# Patient Record
Sex: Male | Born: 1945 | Race: White | Hispanic: No | Marital: Single | Smoking: Never smoker
Health system: Southern US, Community
[De-identification: ages and names within clinical notes are randomized; demographics above are authoritative.]

## PROBLEM LIST (undated history)

## (undated) DIAGNOSIS — Z8669 Personal history of other diseases of the nervous system and sense organs: Secondary | ICD-10-CM

## (undated) HISTORY — PX: COLONOSCOPY: SHX174

## (undated) HISTORY — PX: APPENDECTOMY: SHX54

---

## 2007-12-24 ENCOUNTER — Inpatient Hospital Stay: Payer: Self-pay | Admitting: Urology

## 2007-12-27 ENCOUNTER — Other Ambulatory Visit: Payer: Self-pay

## 2008-01-09 ENCOUNTER — Ambulatory Visit: Payer: Self-pay | Admitting: Gastroenterology

## 2008-01-11 ENCOUNTER — Ambulatory Visit: Payer: Self-pay | Admitting: Gastroenterology

## 2009-05-10 IMAGING — CR DG ABDOMEN 1V
1 series · 2 of 2 positions shown · non-contrast
Comparison: none

REASON FOR EXAM: lithotripsy
COMMENTS:

PROCEDURE:     DXR - DXR KIDNEY URETER BLADDER  - December 28, 2007  [DATE]
RESULT:     Comparison: KUB on 12/24/2007. Renal stone CT on 12/26/2007.

[Series 1: view not recorded · 0.17mm/px · 2 of 2 slices shown]
[im 1/2]
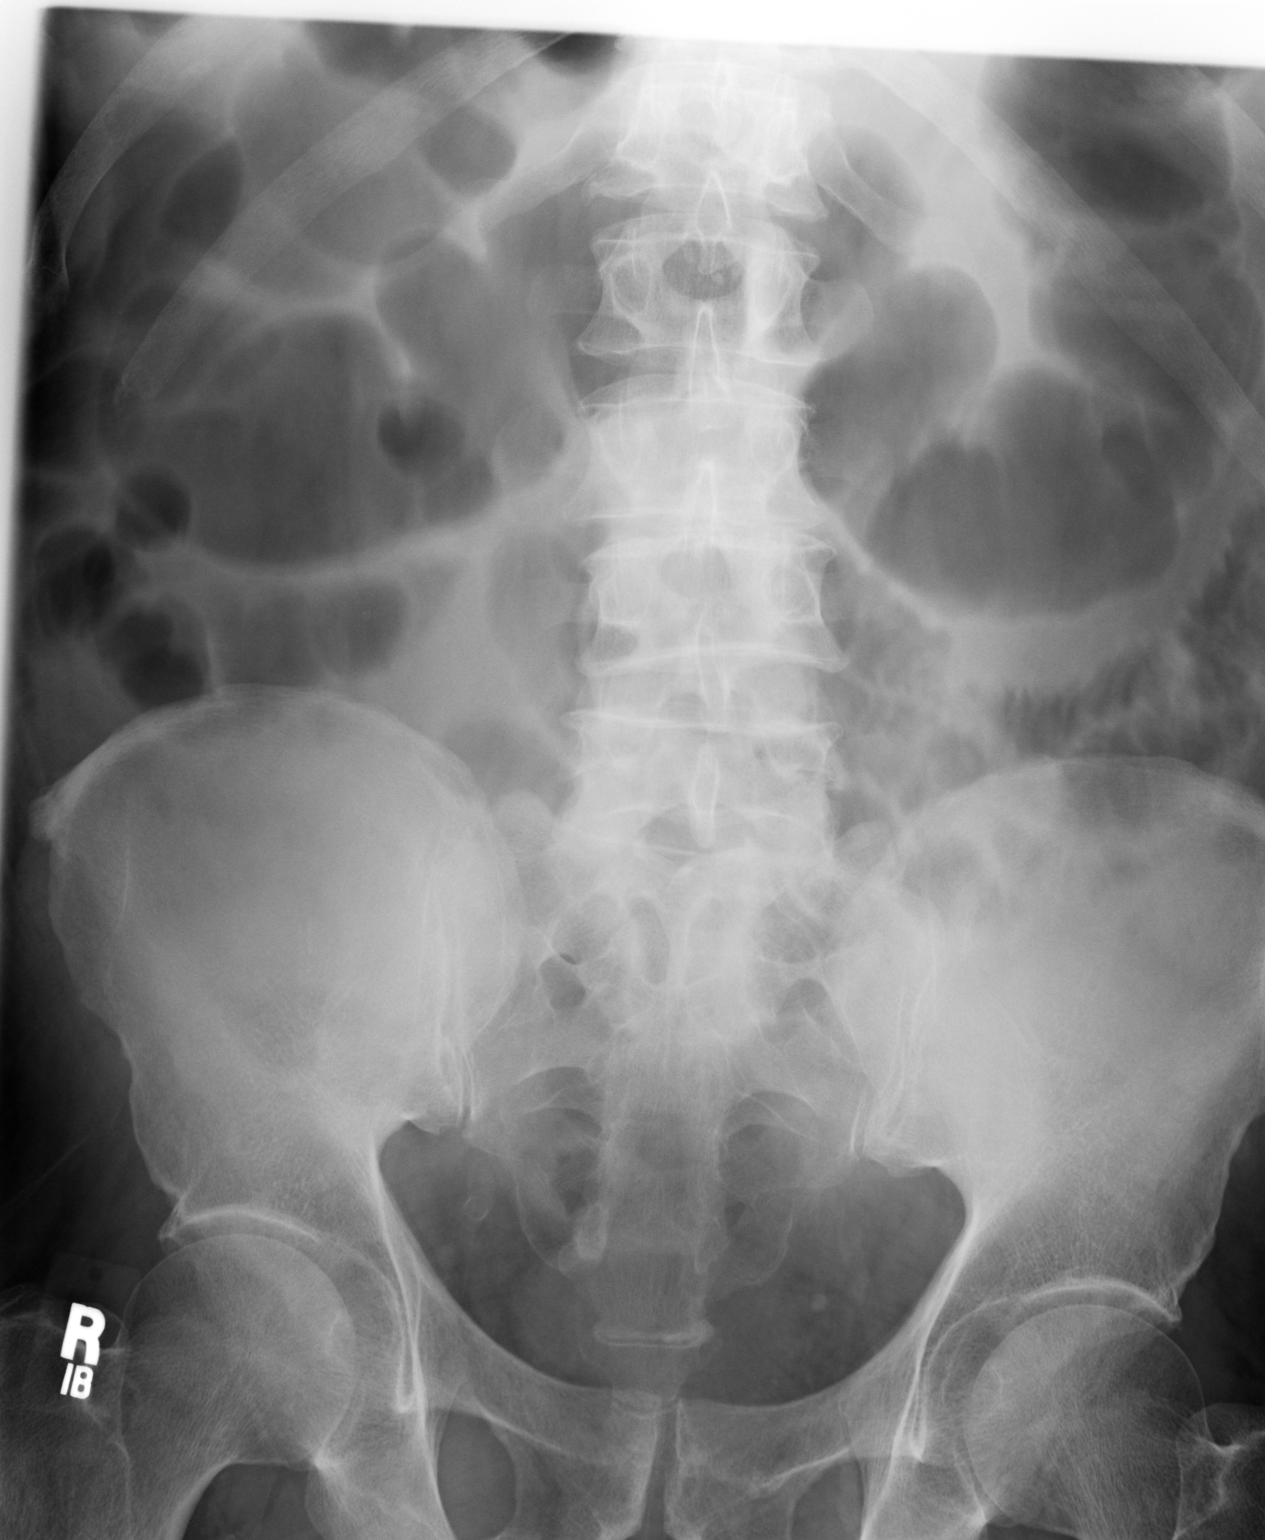
[im 2/2]
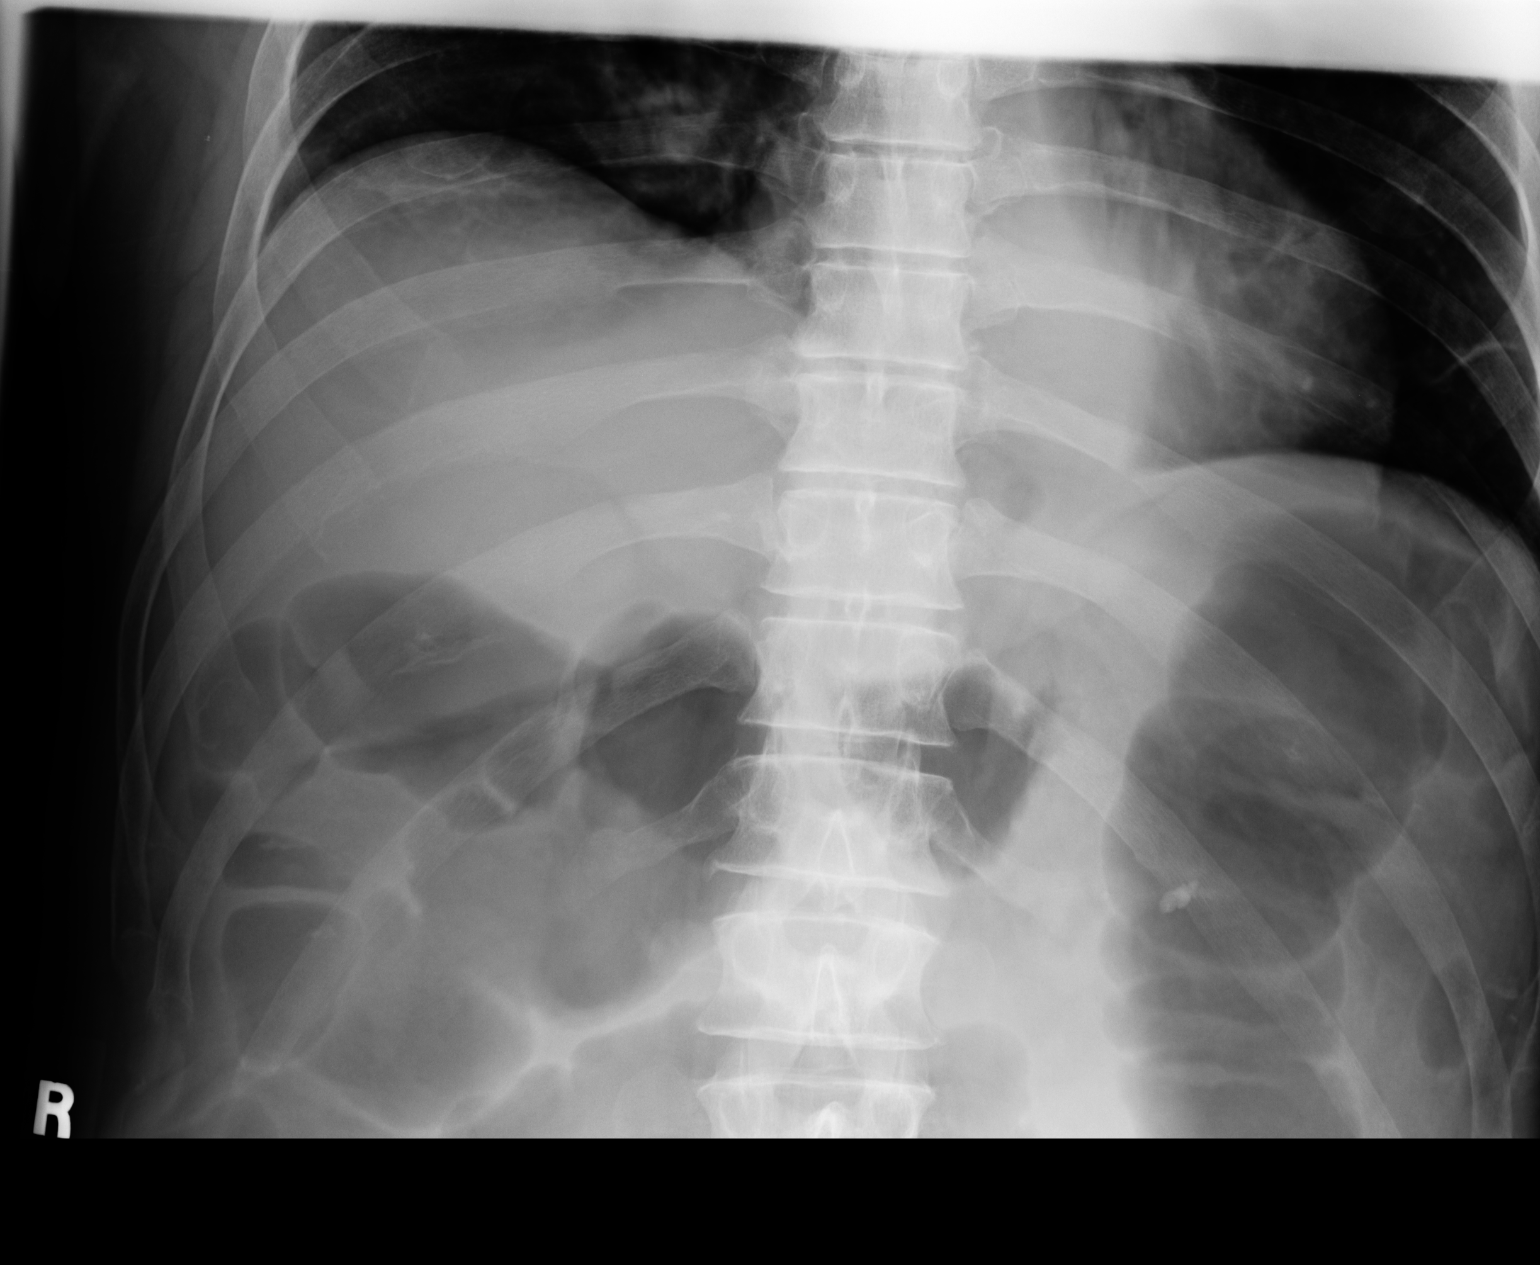

[2 of 2 positions shown; findings below may reference images not displayed]

FINDINGS: 5 x 10 mm left upper pole and renal calculus is similar compare to the
previous exam. 5 mm distal left ureteral stone near the UVJ is also stable
compare to the previous exam. There is mild gaseous distention of the colon
which can be due to mild ileus. There is no evidence of bowel obstruction.
IMPRESSION: 1. Please see above.

## 2011-06-22 ENCOUNTER — Ambulatory Visit: Payer: Self-pay | Admitting: Internal Medicine

## 2011-07-05 ENCOUNTER — Ambulatory Visit: Payer: Self-pay

## 2011-07-08 ENCOUNTER — Ambulatory Visit: Payer: Self-pay | Admitting: Family Medicine

## 2011-09-03 ENCOUNTER — Ambulatory Visit: Payer: Self-pay

## 2011-09-07 ENCOUNTER — Ambulatory Visit: Payer: Self-pay

## 2013-04-12 ENCOUNTER — Ambulatory Visit: Payer: Self-pay

## 2013-04-12 DIAGNOSIS — R109 Unspecified abdominal pain: Secondary | ICD-10-CM | POA: Diagnosis not present

## 2013-04-12 DIAGNOSIS — R110 Nausea: Secondary | ICD-10-CM | POA: Diagnosis not present

## 2013-04-12 LAB — LIPASE, BLOOD: Lipase: 140 U/L (ref 73–393)

## 2013-04-12 LAB — CBC WITH DIFFERENTIAL/PLATELET
Basophil #: 0.1 10*3/uL (ref 0.0–0.1)
Eosinophil #: 0.2 10*3/uL (ref 0.0–0.7)
HCT: 46.1 % (ref 40.0–52.0)
HGB: 15.9 g/dL (ref 13.0–18.0)
Lymphocyte #: 2.2 10*3/uL (ref 1.0–3.6)
Lymphocyte %: 30.8 %
MCHC: 34.6 g/dL (ref 32.0–36.0)
Monocyte %: 16.5 %
Neutrophil %: 48.8 %
RBC: 5.18 10*6/uL (ref 4.40–5.90)
RDW: 12.8 % (ref 11.5–14.5)

## 2013-04-12 LAB — COMPREHENSIVE METABOLIC PANEL
Albumin: 4.5 g/dL (ref 3.4–5.0)
Alkaline Phosphatase: 80 U/L (ref 50–136)
Anion Gap: 7 (ref 7–16)
BUN: 15 mg/dL (ref 7–18)
Bilirubin,Total: 2.4 mg/dL — ABNORMAL HIGH (ref 0.2–1.0)
Calcium, Total: 9 mg/dL (ref 8.5–10.1)
Co2: 29 mmol/L (ref 21–32)
Creatinine: 1.1 mg/dL (ref 0.60–1.30)
EGFR (African American): >60
EGFR (Non-African Amer.): >60
SGOT(AST): 26 U/L (ref 15–37)
SGPT (ALT): 38 U/L (ref 12–78)
Total Protein: 8.3 g/dL — ABNORMAL HIGH (ref 6.4–8.2)

## 2013-04-12 LAB — AMYLASE: Amylase: 43 U/L (ref 25–115)

## 2013-04-16 ENCOUNTER — Ambulatory Visit: Payer: Self-pay

## 2013-04-16 DIAGNOSIS — R1011 Right upper quadrant pain: Secondary | ICD-10-CM | POA: Diagnosis not present

## 2013-04-16 DIAGNOSIS — K7689 Other specified diseases of liver: Secondary | ICD-10-CM | POA: Diagnosis not present

## 2014-07-18 ENCOUNTER — Ambulatory Visit: Payer: Self-pay

## 2014-07-18 DIAGNOSIS — J40 Bronchitis, not specified as acute or chronic: Secondary | ICD-10-CM | POA: Diagnosis not present

## 2015-06-03 ENCOUNTER — Other Ambulatory Visit: Payer: Self-pay | Admitting: Ophthalmology

## 2015-06-03 DIAGNOSIS — H4911 Fourth [trochlear] nerve palsy, right eye: Secondary | ICD-10-CM

## 2015-06-06 ENCOUNTER — Ambulatory Visit
Admission: RE | Admit: 2015-06-06 | Discharge: 2015-06-06 | Disposition: A | Discharge status: Home / Self Care | Payer: Medicare Other | Source: Ambulatory Visit | Attending: Ophthalmology | Admitting: Ophthalmology

## 2015-06-06 ENCOUNTER — Ambulatory Visit
Admission: AD | Admit: 2015-06-06 | Discharge: 2015-06-06 | Disposition: A | Discharge status: Home / Self Care | Payer: Medicare Other | Source: Ambulatory Visit | Attending: Ophthalmology | Admitting: Ophthalmology

## 2015-06-06 ENCOUNTER — Other Ambulatory Visit: Payer: Self-pay | Admitting: Ophthalmology

## 2015-06-06 ENCOUNTER — Other Ambulatory Visit
Admission: AD | Admit: 2015-06-06 | Discharge: 2015-06-06 | Disposition: A | Discharge status: Home / Self Care | Payer: Medicare Other | Source: Ambulatory Visit | Attending: Ophthalmology | Admitting: Ophthalmology

## 2015-06-06 DIAGNOSIS — Z779 Other contact with and (suspected) exposures hazardous to health: Secondary | ICD-10-CM | POA: Diagnosis not present

## 2015-06-06 DIAGNOSIS — T1590XA Foreign body on external eye, part unspecified, unspecified eye, initial encounter: Secondary | ICD-10-CM

## 2015-06-06 DIAGNOSIS — Z01818 Encounter for other preprocedural examination: Secondary | ICD-10-CM | POA: Diagnosis not present

## 2015-06-06 DIAGNOSIS — H532 Diplopia: Secondary | ICD-10-CM | POA: Diagnosis not present

## 2015-06-06 DIAGNOSIS — H4911 Fourth [trochlear] nerve palsy, right eye: Secondary | ICD-10-CM

## 2015-06-06 DIAGNOSIS — Z0100 Encounter for examination of eyes and vision without abnormal findings: Secondary | ICD-10-CM | POA: Insufficient documentation

## 2015-06-06 LAB — CREATININE, SERUM
CREATININE: 0.94 mg/dL (ref 0.61–1.24)
GFR calc Af Amer: >60 mL/min (ref >60)
GFR calc non Af Amer: >60 mL/min (ref >60)

## 2015-06-06 MED ORDER — GADOBENATE DIMEGLUMINE 529 MG/ML IV SOLN
20.0000 mL | Freq: Once | INTRAVENOUS | Status: AC | PRN
  Administered 2015-06-06: 10 mL via INTRAVENOUS

## 2015-06-09 DIAGNOSIS — H4911 Fourth [trochlear] nerve palsy, right eye: Secondary | ICD-10-CM | POA: Diagnosis not present

## 2015-07-02 DIAGNOSIS — H4911 Fourth [trochlear] nerve palsy, right eye: Secondary | ICD-10-CM | POA: Diagnosis not present

## 2017-07-04 ENCOUNTER — Ambulatory Visit
Admission: EM | Admit: 2017-07-04 | Discharge: 2017-07-04 | Disposition: A | Discharge status: Home / Self Care | Payer: Medicare Other | Attending: Family Medicine | Admitting: Family Medicine

## 2017-07-04 ENCOUNTER — Ambulatory Visit: Payer: Medicare Other

## 2017-07-04 DIAGNOSIS — M79661 Pain in right lower leg: Secondary | ICD-10-CM | POA: Diagnosis not present

## 2017-07-04 DIAGNOSIS — M79604 Pain in right leg: Secondary | ICD-10-CM | POA: Diagnosis not present

## 2017-07-04 NOTE — ED Triage Notes (Signed)
Patient complains of right leg pain that feels like someone is stabbing him. Patient states that this occurs twice a year x 10 years. Patient states that pain is along his shin. Patient states that pain started yesterday.

## 2017-07-04 NOTE — Discharge Instructions (Signed)
Establish care and follow up with a Primary Care doctor Tylenol as needed

## 2017-07-04 NOTE — ED Provider Notes (Signed)
[facility]    CSN: [id number] Arrival date & time: 07/04/17  1250     History   Chief Complaint Chief Complaint  Patient presents with  . Leg Pain    right leg    HPI Tali Coster is a 71 y.o. male.   71 yo male presents to urgent care with a c/o periodic sharp intermittent pains to the right lower leg for the past 15 years! States he has not sought care for this symptom until today because he thought it would go away.  States when he feels the pain, it comes on suddenly, it's sharp, shooting and resolves in a few seconds but that the pain is severe enough to make him scream. Denies any trauma, injuries, back pain, numbness/tingling, redness, swelling, rash. States he has not seen a doctor for primary/preventative/general care since he was a teenager.   The history is provided by the patient.  Leg Pain    History reviewed. No pertinent past medical history.  There are no active problems to display for this patient.   Past Surgical History:  Procedure Laterality Date  . APPENDECTOMY         Home Medications    Prior to Admission medications   Medication Sig Start Date End Date Taking? Authorizing Provider  b complex vitamins tablet Take 1 tablet by mouth daily.   Yes [provider]  cholecalciferol (VITAMIN D) 1000 units tablet Take 1,000 Units by mouth daily.   Yes [provider]    Family History History reviewed. No pertinent family history.  Social History Social History  Substance Use Topics  . Smoking status: Never Smoker  . Smokeless tobacco: Never Used  . Alcohol use No     Allergies   Patient has no known allergies.   Review of Systems Review of Systems   Physical Exam Triage Vital Signs ED Triage Vitals [07/04/17 1349]  Enc Vitals Group     BP (!) 145/90     Pulse Rate 65     Resp 16     Temp 98.1 F (36.7 C)     Temp Source Oral     SpO2 99 %     Weight 192 lb (87.1 kg)     Height   (1.854 m)     Head Circumference      Peak Flow      Pain Score 0     Pain Loc      Pain Edu?      Excl. in GC?    No data found.   Updated Vital Signs BP (!) 145/90 (BP Location: Left Arm)   Pulse 65   Temp 98.1 F (36.7 C) (Oral)   Resp 16   Ht  (1.854 m)   Wt 192 lb (87.1 kg)   SpO2 99%   BMI 25.33 kg/m   Visual Acuity Right Eye Distance:   Left Eye Distance:   Bilateral Distance:    Right Eye Near:   Left Eye Near:    Bilateral Near:     Physical Exam  Constitutional: He appears well-developed and well-nourished. No distress.  Musculoskeletal:       Right lower leg: He exhibits bony tenderness (over the tibia). He exhibits no swelling, no edema, no deformity and no laceration.  Skin: He is not diaphoretic.  Nursing note and vitals reviewed.    UC Treatments / Results  Labs (all labs ordered are listed, but only abnormal results  are displayed) Labs Reviewed - No data to display  EKG  EKG Interpretation None       Radiology Dg Tibia/fibula Right  Result Date: 07/04/2017 CLINICAL DATA:  Lower leg pain. EXAM: RIGHT TIBIA AND FIBULA - 2 VIEW COMPARISON:  None. FINDINGS: There is no evidence of fracture or other focal bone lesions. Soft tissues are unremarkable. Vascular calcification is compatible with atherosclerosis and is extensive enough to raise the question of diabetes. IMPRESSION: No acute bony findings. Electronically Signed   By: Kennith Center M.D.   On: 07/04/2017 15:07    Procedures Procedures (including critical care time)  Medications Ordered in UC Medications - No data to display   Initial Impression / Assessment and Plan / UC Course  I have reviewed the triage vital signs and the nursing notes.  Pertinent labs & imaging results that were available during my care of the patient were reviewed by me and considered in my medical decision making (see chart for details).      Final Clinical Impressions(s) / UC Diagnoses   Final  diagnoses:  Right leg pain    New Prescriptions Discharge Medication List as of 07/04/2017  3:16 PM     1. x-ray results and diagnosis reviewed with patient 2. Recommend supportive treatment with otc analgesics prn 3. Establish care with a Primary Care Physician for further evaluation and management  4. Follow-up prn if symptoms worsen or don't improve   Controlled Substance Prescriptions [place] Controlled Substance Registry consulted? Not Applicable   Payton Mccallum, MD 07/04/17 [phone]

## 2017-07-05 DIAGNOSIS — K4020 Bilateral inguinal hernia, without obstruction or gangrene, not specified as recurrent: Secondary | ICD-10-CM | POA: Diagnosis not present

## 2017-07-18 DIAGNOSIS — K4020 Bilateral inguinal hernia, without obstruction or gangrene, not specified as recurrent: Secondary | ICD-10-CM | POA: Diagnosis not present

## 2017-07-21 ENCOUNTER — Encounter
Admission: RE | Admit: 2017-07-21 | Discharge: 2017-07-21 | Disposition: A | Discharge status: Home / Self Care | Payer: Medicare Other | Source: Ambulatory Visit | Attending: Surgery | Admitting: Surgery

## 2017-07-21 DIAGNOSIS — Z01810 Encounter for preprocedural cardiovascular examination: Secondary | ICD-10-CM | POA: Diagnosis present

## 2017-07-21 DIAGNOSIS — K4020 Bilateral inguinal hernia, without obstruction or gangrene, not specified as recurrent: Secondary | ICD-10-CM | POA: Diagnosis not present

## 2017-07-21 DIAGNOSIS — Z01812 Encounter for preprocedural laboratory examination: Secondary | ICD-10-CM | POA: Diagnosis not present

## 2017-07-21 HISTORY — DX: Personal history of other diseases of the nervous system and sense organs: Z86.69

## 2017-07-21 LAB — COMPREHENSIVE METABOLIC PANEL
ALT: 18 U/L (ref 17–63)
AST: 23 U/L (ref 15–41)
Albumin: 4.2 g/dL (ref 3.5–5.0)
Alkaline Phosphatase: 62 U/L (ref 38–126)
Anion gap: 8 (ref 5–15)
BUN: 13 mg/dL (ref 6–20)
CHLORIDE: 103 mmol/L (ref 101–111)
CO2: 25 mmol/L (ref 22–32)
Calcium: 9 mg/dL (ref 8.9–10.3)
Creatinine, Ser: 0.86 mg/dL (ref 0.61–1.24)
GFR calc Af Amer: >60 mL/min (ref >60)
Glucose, Bld: 90 mg/dL (ref 65–99)
POTASSIUM: 3.8 mmol/L (ref 3.5–5.1)
Sodium: 136 mmol/L (ref 135–145)
Total Bilirubin: 2 mg/dL — ABNORMAL HIGH (ref 0.3–1.2)
Total Protein: 7.4 g/dL (ref 6.5–8.1)

## 2017-07-21 LAB — DIFFERENTIAL
BASOS ABS: 0.1 10*3/uL (ref 0–0.1)
Basophils Relative: 1 %
Eosinophils Absolute: 0.2 10*3/uL (ref 0–0.7)
Eosinophils Relative: 3 %
Lymphocytes Relative: 23 %
Lymphs Abs: 1.9 10*3/uL (ref 1.0–3.6)
MONOS PCT: 12 %
Monocytes Absolute: 1 10*3/uL (ref 0.2–1.0)
NEUTROS PCT: 61 %
Neutro Abs: 5.1 10*3/uL (ref 1.4–6.5)

## 2017-07-21 LAB — CBC
HEMATOCRIT: 44.8 % (ref 40.0–52.0)
Hemoglobin: 15.4 g/dL (ref 13.0–18.0)
MCH: 31 pg (ref 26.0–34.0)
MCHC: 34.4 g/dL (ref 32.0–36.0)
MCV: 90.1 fL (ref 80.0–100.0)
Platelets: 256 10*3/uL (ref 150–440)
RBC: 4.97 MIL/uL (ref 4.40–5.90)
RDW: 12.8 % (ref 11.5–14.5)
WBC: 8.3 10*3/uL (ref 3.8–10.6)

## 2017-07-21 NOTE — Patient Instructions (Signed)
Your procedure is scheduled on: Tuesday 08/02/17 Report to DAY SURGERY. 2ND FLOOR MEDICAL MALL ENTRANCE. To find out your arrival time please call [phone] between 1PM - 3PM on Monday 08/01/17.  Remember: Instructions that are not followed completely may result in serious medical risk, up to and including death, or upon the discretion of your surgeon and anesthesiologist your surgery may need to be rescheduled.    __X__ 1. Do not eat anything after midnight the night before your    procedure.  No gum chewing or hard candies.  You may drink clear   liquids up to 2 hours before you are scheduled to arrive at the   hospital for your procedure. Do not drink clear liquids within 2   hours of scheduled arrival to the hospital as this may lead to your   procedure being delayed or rescheduled.       Clear liquids include:   Water or Apple juice without pulp   Clear carbohydrate beverage such as Clearfast or Gatorade   Black coffee or Clear Tea (no milk, no creamer, do not add anything   to the coffee or tea)    Diabetics should only drink water   __X__ 2. No Alcohol for 24 hours before or after surgery.   ____ 3. Bring all medications with you on the day of surgery if instructed.    __X__ 4. Notify your doctor if there is any change in your medical condition     (cold, fever, infections).             __X___5. No smoking within 24 hours of your surgery.     Do not wear jewelry, make-up, hairpins, clips or nail polish.  Do not wear lotions, powders, or perfumes.   Do not shave 48 hours prior to surgery. Men may shave face and neck.  Do not bring valuables to the hospital.    [facility] is not responsible for any belongings or valuables.               Contacts, dentures or bridgework may not be worn into surgery.  Leave your suitcase in the car. After surgery it may be brought to your room.  For patients admitted to the hospital, discharge time is determined by your                 treatment team.   Patients discharged the day of surgery will not be allowed to drive home.   Please read over the following fact sheets that you were given:   MRSA Information   __X__ Take these medicines the morning of surgery with A SIP OF WATER:    1. NONE  2.   3.   4.  5.  6.  ____ Fleet Enema (as directed)   __X__ Use CHG Soap/SAGE wipes as directed  ____ Use inhalers on the day of surgery  ____ Stop metformin 2 days prior to surgery    ____ Take 1/2 of usual insulin dose the night before surgery and none on the morning of surgery.   ____ Stop Coumadin/Plavix/aspirin on   __X__ Stop Anti-inflammatories such as Advil, Aleve, Ibuprofen, Motrin, Naproxen, Naprosyn, Goodies,powder, or aspirin products.  OK to take Tylenol.   __X__ Stop supplements, Vitamin E, Fish Oil until after surgery.    ____ Bring C-Pap to the hospital.  

## 2017-08-01 MED ORDER — CEFAZOLIN SODIUM-DEXTROSE 2-4 GM/100ML-% IV SOLN
2.0000 g | Freq: Once | INTRAVENOUS | Status: AC
  Administered 2017-08-02: 2 g via INTRAVENOUS

## 2017-08-02 ENCOUNTER — Encounter
Admission: RE | Disposition: A | Discharge status: Home / Self Care | Payer: Self-pay | Source: Ambulatory Visit | Attending: Surgery

## 2017-08-02 ENCOUNTER — Ambulatory Visit: Payer: Medicare Other | Admitting: Anesthesiology

## 2017-08-02 ENCOUNTER — Encounter: Payer: Self-pay | Admitting: *Deleted

## 2017-08-02 ENCOUNTER — Ambulatory Visit
Admission: RE | Admit: 2017-08-02 | Discharge: 2017-08-02 | Disposition: A | Discharge status: Home / Self Care | Payer: Medicare Other | Source: Ambulatory Visit | Attending: Surgery | Admitting: Surgery

## 2017-08-02 DIAGNOSIS — K4020 Bilateral inguinal hernia, without obstruction or gangrene, not specified as recurrent: Secondary | ICD-10-CM | POA: Insufficient documentation

## 2017-08-02 DIAGNOSIS — G40909 Epilepsy, unspecified, not intractable, without status epilepticus: Secondary | ICD-10-CM | POA: Insufficient documentation

## 2017-08-02 HISTORY — PX: INGUINAL HERNIA REPAIR: SHX194

## 2017-08-02 SURGERY — REPAIR, HERNIA, INGUINAL, BILATERAL, ADULT
Anesthesia: General | Laterality: Bilateral

## 2017-08-02 MED ORDER — PROPOFOL 10 MG/ML IV BOLUS
INTRAVENOUS | Status: DC | PRN
  Administered 2017-08-02: 120 mg via INTRAVENOUS

## 2017-08-02 MED ORDER — ROCURONIUM BROMIDE 100 MG/10ML IV SOLN
INTRAVENOUS | Status: DC | PRN
  Administered 2017-08-02: 10 mg via INTRAVENOUS
  Administered 2017-08-02: 50 mg via INTRAVENOUS
  Administered 2017-08-02: 10 mg via INTRAVENOUS

## 2017-08-02 MED ORDER — LACTATED RINGERS IV SOLN
INTRAVENOUS | Status: DC
  Administered 2017-08-02 (×3): via INTRAVENOUS

## 2017-08-02 MED ORDER — CEFAZOLIN SODIUM-DEXTROSE 2-4 GM/100ML-% IV SOLN
INTRAVENOUS | Status: AC
  Filled 2017-08-02: qty 100

## 2017-08-02 MED ORDER — HYDROCODONE-ACETAMINOPHEN 5-325 MG PO TABS: 1.0000 | ORAL_TABLET | Freq: Four times a day (QID) | ORAL | 0 refills | Status: DC | PRN

## 2017-08-02 MED ORDER — BUPIVACAINE-EPINEPHRINE (PF) 0.5% -1:200000 IJ SOLN
INTRAMUSCULAR | Status: AC
  Filled 2017-08-02: qty 30

## 2017-08-02 MED ORDER — PHENYLEPHRINE HCL 10 MG/ML IJ SOLN
INTRAMUSCULAR | Status: DC | PRN
  Administered 2017-08-02 (×3): 100 ug via INTRAVENOUS

## 2017-08-02 MED ORDER — FENTANYL CITRATE (PF) 100 MCG/2ML IJ SOLN
25.0000 ug | INTRAMUSCULAR | Status: DC | PRN
  Administered 2017-08-02 (×2): 25 ug via INTRAVENOUS

## 2017-08-02 MED ORDER — ONDANSETRON HCL 4 MG/2ML IJ SOLN
INTRAMUSCULAR | Status: DC | PRN
  Administered 2017-08-02: 4 mg via INTRAVENOUS

## 2017-08-02 MED ORDER — OXYCODONE HCL 5 MG PO TABS: 5.0000 mg | ORAL_TABLET | Freq: Once | ORAL | Status: DC | PRN

## 2017-08-02 MED ORDER — FAMOTIDINE 20 MG PO TABS
ORAL_TABLET | ORAL | Status: AC
  Administered 2017-08-02: 20 mg via ORAL
  Filled 2017-08-02: qty 1

## 2017-08-02 MED ORDER — SUGAMMADEX SODIUM 200 MG/2ML IV SOLN
INTRAVENOUS | Status: AC
  Filled 2017-08-02: qty 2

## 2017-08-02 MED ORDER — OXYCODONE HCL 5 MG/5ML PO SOLN: 5.0000 mg | Freq: Once | ORAL | Status: DC | PRN

## 2017-08-02 MED ORDER — SUGAMMADEX SODIUM 200 MG/2ML IV SOLN
INTRAVENOUS | Status: DC | PRN
  Administered 2017-08-02: 173.2 mg via INTRAVENOUS

## 2017-08-02 MED ORDER — FENTANYL CITRATE (PF) 100 MCG/2ML IJ SOLN
INTRAMUSCULAR | Status: AC
  Administered 2017-08-02: 25 ug via INTRAVENOUS
  Filled 2017-08-02: qty 2

## 2017-08-02 MED ORDER — HYDROCODONE-ACETAMINOPHEN 5-325 MG PO TABS: 1.0000 | ORAL_TABLET | ORAL | Status: DC | PRN

## 2017-08-02 MED ORDER — LIDOCAINE HCL (CARDIAC) 20 MG/ML IV SOLN
INTRAVENOUS | Status: DC | PRN
  Administered 2017-08-02: 40 mg via INTRAVENOUS

## 2017-08-02 MED ORDER — BUPIVACAINE-EPINEPHRINE (PF) 0.5% -1:200000 IJ SOLN
INTRAMUSCULAR | Status: DC | PRN
  Administered 2017-08-02: 15 mL

## 2017-08-02 MED ORDER — MIDAZOLAM HCL 2 MG/2ML IJ SOLN
INTRAMUSCULAR | Status: DC | PRN
  Administered 2017-08-02: 1 mg via INTRAVENOUS

## 2017-08-02 MED ORDER — LIDOCAINE HCL (PF) 2 % IJ SOLN
INTRAMUSCULAR | Status: AC
  Filled 2017-08-02: qty 10

## 2017-08-02 MED ORDER — FENTANYL CITRATE (PF) 100 MCG/2ML IJ SOLN
INTRAMUSCULAR | Status: DC | PRN
  Administered 2017-08-02 (×2): 50 ug via INTRAVENOUS

## 2017-08-02 MED ORDER — FAMOTIDINE 20 MG PO TABS
20.0000 mg | ORAL_TABLET | Freq: Once | ORAL | Status: AC
  Administered 2017-08-02: 20 mg via ORAL

## 2017-08-02 MED ORDER — ROCURONIUM BROMIDE 50 MG/5ML IV SOLN
INTRAVENOUS | Status: AC
  Filled 2017-08-02: qty 1

## 2017-08-02 MED ORDER — PROPOFOL 10 MG/ML IV BOLUS
INTRAVENOUS | Status: AC
  Filled 2017-08-02: qty 20

## 2017-08-02 MED ORDER — MIDAZOLAM HCL 2 MG/2ML IJ SOLN
INTRAMUSCULAR | Status: AC
  Filled 2017-08-02: qty 2

## 2017-08-02 MED ORDER — DEXAMETHASONE SODIUM PHOSPHATE 10 MG/ML IJ SOLN
INTRAMUSCULAR | Status: DC | PRN
  Administered 2017-08-02: 10 mg via INTRAVENOUS

## 2017-08-02 MED ORDER — DEXAMETHASONE SODIUM PHOSPHATE 10 MG/ML IJ SOLN
INTRAMUSCULAR | Status: AC
  Filled 2017-08-02: qty 1

## 2017-08-02 MED ORDER — FENTANYL CITRATE (PF) 100 MCG/2ML IJ SOLN
INTRAMUSCULAR | Status: AC
  Filled 2017-08-02: qty 2

## 2017-08-02 SURGICAL SUPPLY — 27 items
BLADE SURG 15 STRL LF DISP TIS (BLADE) ×1 IMPLANT
BLADE SURG 15 STRL SS (BLADE) ×2
CANISTER SUCT 1200ML W/VALVE (MISCELLANEOUS) ×3 IMPLANT
CHLORAPREP W/TINT 26ML (MISCELLANEOUS) ×3 IMPLANT
DERMABOND ADVANCED (GAUZE/BANDAGES/DRESSINGS) ×4
DERMABOND ADVANCED .7 DNX12 (GAUZE/BANDAGES/DRESSINGS) ×2 IMPLANT
DRAIN PENROSE 5/8X18 LTX STRL (WOUND CARE) ×3 IMPLANT
DRAPE LAPAROTOMY 77X122 PED (DRAPES) ×3 IMPLANT
DRAPE LAPAROTOMY TRNSV 106X77 (MISCELLANEOUS) ×3 IMPLANT
ELECT REM PT RETURN 9FT ADLT (ELECTROSURGICAL) ×3
ELECTRODE REM PT RTRN 9FT ADLT (ELECTROSURGICAL) ×1 IMPLANT
GLOVE BIO SURGEON STRL SZ7.5 (GLOVE) ×3 IMPLANT
GOWN STRL REUS W/ TWL LRG LVL3 (GOWN DISPOSABLE) ×3 IMPLANT
GOWN STRL REUS W/TWL LRG LVL3 (GOWN DISPOSABLE) ×6
KIT RM TURNOVER STRD PROC AR (KITS) ×3 IMPLANT
LABEL OR SOLS (LABEL) ×3 IMPLANT
MESH SYNTHETIC 4X6 SOFT BARD (Mesh General) ×1 IMPLANT
MESH SYNTHETIC SOFT BARD 4X6 (Mesh General) ×2 IMPLANT
NEEDLE HYPO 25X1 1.5 SAFETY (NEEDLE) ×3 IMPLANT
NS IRRIG 500ML POUR BTL (IV SOLUTION) ×3 IMPLANT
PACK BASIN MINOR ARMC (MISCELLANEOUS) ×3 IMPLANT
SUT CHROMIC 4 0 RB 1X27 (SUTURE) ×3 IMPLANT
SUT MNCRL AB 4-0 PS2 18 (SUTURE) ×6 IMPLANT
SUT SURGILON 0 30 BLK (SUTURE) ×9 IMPLANT
SUT VIC AB 4-0 SH 27 (SUTURE) ×4
SUT VIC AB 4-0 SH 27XANBCTRL (SUTURE) ×2 IMPLANT
SYRINGE 10CC LL (SYRINGE) ×3 IMPLANT

## 2017-08-02 NOTE — Op Note (Signed)
OPERATIVE REPORT  PREOPERATIVE DIAGNOSIS: bilateral inguinal hernia  POSTOPERATIVE DIAGNOSIS:bilateral  inguinal hernia  PROCEDURE:  bilateral inguinal hernia repair  ANESTHESIA:  General  SURGEON:  Renda RollsWilton Zayquan Bogard M.D.  INDICATIONS:. He reports a history of bulging in the groin on both sides. Bilateral inguinal hernias were demonstrated on physical exam and repair is recommended for definitive treatment.   With the patient on the operating table in the supine position the bilateral lower quadrant was prepared with clippers and with ChloraPrep and draped in a sterile manner. A left transversely oriented suprapubic incision was made and carried down through subcutaneous tissues. Electrocautery was used for hemostasis. The Scarpa's fascia was incised. The external oblique aponeurosis was incised along the course of its fibers to open the external ring and expose the inguinal cord structures. The cord structures were mobilized. A Penrose drain was passed around the cord structures for traction. Cremaster fibers were separated to explore the cord structures. There was no indirect hernia sac. There was a direct inguinal hernia. As this was dissected the attenuated transversalis fascia was incised circumferentially and removed. The sac was inverted. The repair was carried out with 0 Surgilon sutures suturing the conjoined tendon to the shelving edge of the inguinal ligament incorporating transversalis fascia into the repair. The suture line extended from the pubic tubercle to the internal ring.  Bard soft mesh was cut to create an oval shape and was placed over the repair. This was sutured to the repair with interrupted 0 Surgilon sutures and also sutured medially to the deep fascia and on both sides of the internal ring. Next after seeing hemostasis was intact the cord structures were replaced along the floor of the inguinal canal. The cut edges of the external oblique aponeurosis were closed with a running  4-0 Vicryl suture to re-create the external ring. The deep fascia superior and lateral to the repair site was infiltrated with half percent Sensorcaine with epinephrine. Subcutaneous tissues were also infiltrated. The Scarpa's fascia was closed with interrupted 4-0 Vicryl sutures. The skin was closed with running 4-0 Monocryl subcuticular suture.  Next on the right side a transverse suprapubic incision was made and carried down through subcutaneous tissues. Electrocautery was used for hemostasis. The Scarpa's fascia was incised. The external oblique aponeurosis was incised along the course of its fibers to open the external ring and expose the inguinal cord structures. The cord structures were mobilized. A Penrose drain was passed around the cord structures for traction. Cremaster fibers were separated to explore the cord structures. There was no indirect hernia sac. There was a direct inguinal hernia. As this was dissected the attenuated transversalis fascia was incised circumferentially and removed. The sac was inverted. The repair was carried out with 0 Surgilon sutures suturing the conjoined tendon to the shelving edge of the inguinal ligament incorporating transversalis fascia into the repair. The suture line extended from the pubic tubercle to the internal ring.  Bard soft mesh was cut to create an oval shape and was placed over the repair. This was sutured to the repair with interrupted 0 Surgilon sutures and also sutured medially to the deep fascia and on both sides of the internal ring. Next after seeing hemostasis was intact the cord structures were replaced along the floor of the inguinal canal. The cut edges of the external oblique aponeurosis were closed with a running 4-0 Vicryl suture to re-create the external ring. The deep fascia superior and lateral to the repair site was infiltrated with half percent Sensorcaine  with epinephrine. Subcutaneous tissues were also infiltrated. The Scarpa's fascia  was closed with interrupted 4-0 Vicryl sutures. The skin was closed with running 4-0 Monocryl subcuticular suture. Both wounds were then treated with Dermabond.  The patient appeared to be in satisfactory condition and was prepared for transfer to the recovery room.  Renda Rolls M.D.

## 2017-08-02 NOTE — Anesthesia Procedure Notes (Signed)
Procedure Name: Intubation Date/Time: 08/02/2017 9:40 AM Performed by: Henrietta HooverPOPE, Carlisle Torgeson Pre-anesthesia Checklist: Patient identified, Emergency Drugs available, Suction available, Patient being monitored and Timeout performed Patient Re-evaluated:Patient Re-evaluated prior to induction Oxygen Delivery Method: Circle system utilized Preoxygenation: Pre-oxygenation with 100% oxygen Induction Type: IV induction Ventilation: Mask ventilation without difficulty Laryngoscope Size: McGraph and 4 Grade View: Grade I Tube type: Oral Tube size: 7.5 mm Number of attempts: 1 Airway Equipment and Method: Stylet Placement Confirmation: ETT inserted through vocal cords under direct vision,  positive ETCO2 and breath sounds checked- equal and bilateral Secured at: 22 cm Tube secured with: Tape Dental Injury: Teeth and Oropharynx as per pre-operative assessment

## 2017-08-02 NOTE — H&P (Signed)
  He comes in prepared for bilateral inguinal hernia repair. He reports no change in overall condition since the office visit.  Labs noted.. Total bilirubin is chronically elevated  Both sides were marked yes.  I discussed the plan for bilateral inguinal hernia repair

## 2017-08-02 NOTE — Anesthesia Postprocedure Evaluation (Signed)
Anesthesia Post Note  Patient: Harold Curtis  Procedure(s) Performed: HERNIA REPAIR INGUINAL ADULT BILATERAL (Bilateral )  Patient location during evaluation: PACU Anesthesia Type: General Level of consciousness: awake and alert Pain management: pain level controlled Vital Signs Assessment: post-procedure vital signs reviewed and stable Respiratory status: spontaneous breathing, nonlabored ventilation, respiratory function stable and patient connected to nasal cannula oxygen Cardiovascular status: blood pressure returned to baseline and stable Postop Assessment: no apparent nausea or vomiting Anesthetic complications: no     Last Vitals:  Vitals:   08/02/17 1240 08/02/17 1250  BP:  131/83  Pulse: 70 72  Resp: 12 15  Temp:  37.1 C  SpO2: 96% 99%    Last Pain:  Vitals:   08/02/17 1250  TempSrc:   PainSc: 3                  Cleda MccreedyJoseph K Piscitello

## 2017-08-02 NOTE — Anesthesia Post-op Follow-up Note (Signed)
Anesthesia QCDR form completed.        

## 2017-08-02 NOTE — Discharge Instructions (Addendum)
Take Tylenol or Norco if needed for pain.  Should not drive or do anything dangerous when taking Norco.  May shower and blot dry.  Avoid straining and heavy lifting.   AMBULATORY SURGERY  DISCHARGE INSTRUCTIONS  1) The drugs that you were given will stay in your system until tomorrow so for the next 24 hours you should not: A) Drive an automobile B) Make any legal decisions C) Drink any alcoholic beverage  2) You may resume regular meals tomorrow.  Today it is better to start with liquids and gradually work up to solid foods. You may eat anything you prefer, but it is better to start with liquids, then soup and crackers, and gradually work up to solid foods.  3) Please notify your doctor immediately if you have any unusual bleeding, trouble breathing, redness and pain at the surgery site, drainage, fever, or pain not relieved by medication.  Please contact your physician with any problems or Same Day Surgery at [phone], Monday through Friday 6 am to 4 pm, or [place] [facility] Main number at [phone].

## 2017-08-02 NOTE — Anesthesia Preprocedure Evaluation (Addendum)
Anesthesia Evaluation  Patient identified by MRN, date of birth, ID band Patient awake    Reviewed: Allergy & Precautions, H&P , NPO status , Patient's Chart, lab work & pertinent test results  History of Anesthesia Complications Negative for: history of anesthetic complications  Airway Mallampati: III  TM Distance: <3 FB Neck ROM: limited    Dental  (+) Poor Dentition, Chipped, Missing, Caps   Pulmonary neg pulmonary ROS, neg shortness of breath,           Cardiovascular Exercise Tolerance: Good (-) angina(-) Past MI and (-) DOE negative cardio ROS       Neuro/Psych Seizures -, Well Controlled,  negative psych ROS   GI/Hepatic negative GI ROS, Neg liver ROS, neg GERD  ,  Endo/Other  negative endocrine ROS  Renal/GU negative Renal ROS  negative genitourinary   Musculoskeletal   Abdominal   Peds  Hematology negative hematology ROS (+)   Anesthesia Other Findings Past Medical History: No date: History of epilepsy     Comment:  as child  Past Surgical History: No date: APPENDECTOMY No date: COLONOSCOPY  BMI    Body Mass Index:  25.20 kg/m      Reproductive/Obstetrics negative OB ROS                             Anesthesia Physical Anesthesia Plan  ASA: II  Anesthesia Plan: General   Post-op Pain Management:    Induction: Intravenous  PONV Risk Score and Plan: 3 and Ondansetron, Dexamethasone and Treatment may vary due to age or medical condition  Airway Management Planned: Oral ETT  Additional Equipment:   Intra-op Plan:   Post-operative Plan: Extubation in OR  Informed Consent: I have reviewed the patients History and Physical, chart, labs and discussed the procedure including the risks, benefits and alternatives for the proposed anesthesia with the patient or authorized representative who has indicated his/her understanding and acceptance.   Dental Advisory  Given  Plan Discussed with: Anesthesiologist, CRNA and Surgeon  Anesthesia Plan Comments: (Patient consented for risks of anesthesia including but not limited to:  - adverse reactions to medications - risk of intubation if required - damage to teeth, lips or other oral mucosa - sore throat or hoarseness - Damage to heart, brain, lungs or loss of life  Patient voiced understanding.)       Anesthesia Quick Evaluation

## 2017-08-02 NOTE — Transfer of Care (Signed)
Immediate Anesthesia Transfer of Care Note  Patient: Harold Curtis  Procedure(s) Performed: HERNIA REPAIR INGUINAL ADULT BILATERAL (Bilateral )  Patient Location: PACU  Anesthesia Type:General  Level of Consciousness: awake  Airway & Oxygen Therapy: Patient Spontanous Breathing and Patient connected to face mask oxygen  Post-op Assessment: Report given to RN and Post -op Vital signs reviewed and stable  Post vital signs: Reviewed and stable  Last Vitals:  Vitals:   08/02/17 0818 08/02/17 1205  BP: (!) 149/74 (!) 146/78  Pulse: 77 84  Resp: 16 11  Temp: (!) 36.3 C 36.9 C  SpO2: 100% 100%    Last Pain:  Vitals:   08/02/17 0818  TempSrc: Tympanic         Complications: No apparent anesthesia complications

## 2021-04-25 ENCOUNTER — Other Ambulatory Visit: Payer: Self-pay

## 2021-04-25 ENCOUNTER — Encounter: Payer: Self-pay | Admitting: Emergency Medicine

## 2021-04-25 ENCOUNTER — Ambulatory Visit
Admission: EM | Admit: 2021-04-25 | Discharge: 2021-04-25 | Disposition: A | Discharge status: Home / Self Care | Payer: Medicare Other | Attending: Family Medicine | Admitting: Family Medicine

## 2021-04-25 DIAGNOSIS — U071 COVID-19: Secondary | ICD-10-CM | POA: Diagnosis not present

## 2021-04-25 DIAGNOSIS — I4891 Unspecified atrial fibrillation: Secondary | ICD-10-CM

## 2021-04-25 NOTE — ED Triage Notes (Signed)
Patient c/o cough and congestion that started couple days ago.  Patient did a home covid test on 04/20/21 and was positive.

## 2021-04-25 NOTE — ED Notes (Signed)
Patient is being discharged from the Urgent Care and sent to the Emergency Department via private vehicle . Per Dr. Adriana Simas, patient is in need of higher level of care due to Atrial Fibrillation dx. Patient is aware and verbalizes understanding of plan of care.  Vitals:   04/25/21 1421  BP: 130/88  Pulse: 77  Resp: 16  Temp: 98.3 F (36.8 C)  SpO2: 98%

## 2021-04-25 NOTE — ED Provider Notes (Addendum)
[facility]    CSN: [id number] Arrival date & time: 04/25/21  1359      History   Chief Complaint Chief Complaint  Patient presents with   Cough    COVID+ home test   HPI  75 year old male presents with the above complaint.  Patient reports that he has been sick for past 1 to 2 weeks.  He is unsure of exactly when he started to not feel well.  He reports cough and congestion and recent fever.  Fever has now resolved.  He states that he is currently just feeling weak and fatigued.  He tested positive for COVID via a home test on 7/11 and then retested again and was positive on 7/15.  His only complaints at this time are weakness and fatigue.  No current fever.  No reports of shortness of breath.  He has an occasional cough.  No other complaints or concerns at this time.  Past Medical History:  Diagnosis Date   History of epilepsy    as child   Past Surgical History:  Procedure Laterality Date   APPENDECTOMY     COLONOSCOPY     INGUINAL HERNIA REPAIR Bilateral 08/02/2017   Procedure: HERNIA REPAIR INGUINAL ADULT BILATERAL;  Surgeon: Nadeen Landau, MD;  Location: [facility] ORS;  Service: General;  Laterality: Bilateral;       Home Medications    Prior to Admission medications   Medication Sig Start Date End Date Taking? Authorizing Provider  cholecalciferol (VITAMIN D) 1000 units tablet Take 2,000-3,000 Units by mouth daily.    Yes [provider]  Coenzyme Q10 (CO Q-10) 100 MG CAPS Take 1 capsule by mouth daily.   Yes [provider]  Multiple Vitamins-Minerals (MULTIVITAMIN WITH MINERALS) tablet Take 1 tablet by mouth daily.   Yes [provider]  b complex vitamins tablet Take 1 tablet by mouth daily.    [provider]  HYDROcodone-acetaminophen (NORCO) 5-325 MG tablet Take 1-2 tablets by mouth every 6 (six) hours as needed for moderate pain. 08/02/17   Nadeen Landau, MD  saccharomyces boulardii (FLORASTOR) 250  MG capsule Take 250 mg by mouth daily.    [provider]    Social History Social History   Tobacco Use   Smoking status: Never   Smokeless tobacco: Never  Vaping Use   Vaping Use: Never used  Substance Use Topics   Alcohol use: No   Drug use: No     Allergies   Patient has no known allergies.   Review of Systems Review of Systems As Per HPI  Physical Exam Triage Vital Signs ED Triage Vitals  Enc Vitals Group     BP 04/25/21 1421 130/88     Pulse Rate 04/25/21 1421 77     Resp 04/25/21 1421 16     Temp 04/25/21 1421 98.3 F (36.8 C)     Temp Source 04/25/21 1421 Oral     SpO2 04/25/21 1421 98 %     Weight 04/25/21 1419 195 lb 5.2 oz (88.6 kg)     Height 04/25/21 1419 6\' 1"  (1.854 m)     Head Circumference --      Peak Flow --      Pain Score 04/25/21 1419 0     Pain Loc --      Pain Edu? --      Excl. in GC? --    Updated Vital Signs BP 130/88 (BP Location: Left Arm)   Pulse  77   Temp 98.3 F (36.8 C) (Oral)   Resp 16   Ht 6\' 1"  (1.854 m)   Wt 88.6 kg   SpO2 98%   BMI 25.77 kg/m   Visual Acuity Right Eye Distance:   Left Eye Distance:   Bilateral Distance:    Right Eye Near:   Left Eye Near:    Bilateral Near:     Physical Exam Vitals and nursing note reviewed.  Constitutional:      General: He is not in acute distress.    Appearance: Normal appearance. He is not ill-appearing.  HENT:     Head: Normocephalic and atraumatic.  Eyes:     General:        Right eye: No discharge.        Left eye: No discharge.     Conjunctiva/sclera: Conjunctivae normal.  Cardiovascular:     Rate and Rhythm: Tachycardia present. Rhythm irregularly irregular.  Pulmonary:     Effort: Pulmonary effort is normal.     Breath sounds: Normal breath sounds. No wheezing, rhonchi or rales.  Neurological:     Mental Status: He is alert.   UC Treatments / Results  Labs (all labs ordered are listed, but only abnormal results are displayed) Labs Reviewed  - No data to display  EKG Interpretation: Atrial fibrillation with rapid ventricular response at the rate of 103.  T wave inversion noted in lead III.  Radiology No results found.  Procedures Procedures (including critical care time)  Medications Ordered in UC Medications - No data to display  Initial Impression / Assessment and Plan / UC Course  I have reviewed the triage vital signs and the nursing notes.  Pertinent labs & imaging results that were available during my care of the patient were reviewed by me and considered in my medical decision making (see chart for details).    75 year old male presents with COVID-19.  Exam revealed an irregularly irregular rhythm.  EKG was obtained and revealed atrial fibrillation.  This is new onset atrial fibrillation.  Likely brought on by COVID-19.  Patient is not a candidate for antiviral treatment with the new oral antiviral medication given the fact that he has been sick for greater than 5 days.  I have no capability to draw labs or proceed with further evaluation regarding his atrial fibrillation.  Patient is hemodynamically stable at this time.  Declines EMS transport. He is going to [facility] ER via private vehicle given new onset atrial fibrillation.  Final Clinical Impressions(s) / UC Diagnoses   Final diagnoses:  COVID  New onset atrial fibrillation [facility])     Discharge Instructions      Go directly to the ER.  You have COVID and new onset atrial fibrillation.  You need labs and further evaluation. You need a cardiology referral and will need anticoagulation.  Dr. [facility]    ED Prescriptions   None    PDMP not reviewed this encounter.      Adriana Simas, Tommie Sams 04/25/21 1512

## 2021-04-25 NOTE — Discharge Instructions (Addendum)
Go directly to the ER.  You have COVID and new onset atrial fibrillation.  You need labs and further evaluation. You need a cardiology referral and will need anticoagulation.  Dr. Adriana Simas

## 2023-12-16 ENCOUNTER — Other Ambulatory Visit: Payer: Self-pay

## 2023-12-16 ENCOUNTER — Emergency Department
Admission: EM | Admit: 2023-12-16 | Discharge: 2023-12-16 | Disposition: A | Discharge status: Home / Self Care | Attending: Emergency Medicine | Admitting: Emergency Medicine

## 2023-12-16 DIAGNOSIS — I10 Essential (primary) hypertension: Secondary | ICD-10-CM | POA: Insufficient documentation

## 2023-12-16 DIAGNOSIS — N3000 Acute cystitis without hematuria: Secondary | ICD-10-CM | POA: Insufficient documentation

## 2023-12-16 DIAGNOSIS — R339 Retention of urine, unspecified: Secondary | ICD-10-CM | POA: Diagnosis present

## 2023-12-16 DIAGNOSIS — I4891 Unspecified atrial fibrillation: Secondary | ICD-10-CM | POA: Diagnosis not present

## 2023-12-16 LAB — URINALYSIS, ROUTINE W REFLEX MICROSCOPIC
Bilirubin Urine: NEGATIVE
Glucose, UA: NEGATIVE mg/dL
Ketones, ur: NEGATIVE mg/dL
Nitrite: NEGATIVE
Protein, ur: 100 mg/dL — AB
Specific Gravity, Urine: 1.009 (ref 1.005–1.030)
Squamous Epithelial / HPF: 0 /HPF (ref 0–5)
WBC, UA: >50 WBC/hpf (ref 0–5)
pH: 7 (ref 5.0–8.0)

## 2023-12-16 MED ORDER — CEPHALEXIN 500 MG PO CAPS: 500.0000 mg | ORAL_CAPSULE | Freq: Three times a day (TID) | ORAL | 0 refills | Status: AC

## 2023-12-16 NOTE — ED Provider Notes (Signed)
 [facility] Provider Note    Event Date/Time   First MD Initiated Contact with Patient 12/16/23 1022     (approximate)   History   Urinary Retention   HPI  Harold Curtis is a 78 y.o. male  with history of BPH, left renal mass, HTN,  atrial fibrillation, and as listed in EMR presents to the emergency department for evaluation of urinary retention. Catheter initially inserted in December due to BPH and urinary retention. He declined PAE and decided to continue with the catheter. He reports follow up at [facility] yesterday and the catheter was removed. Since then, he has only urinated small amounts at a time and now feels like his is going to "bust." He also reports having had a grey substance around the tip of the penis prior to catheter removal. He had noted a scant amount of blood on the catheter tubing after erection, but otherwise no blood in foley bag.       Physical Exam   Triage Vital Signs: ED Triage Vitals  Encounter Vitals Group     BP 12/16/23 1019 116/75     Systolic BP Percentile --      Diastolic BP Percentile --      Pulse Rate 12/16/23 1019 93     Resp 12/16/23 1019 18     Temp 12/16/23 1019 98 F (36.7 C)     Temp src --      SpO2 12/16/23 1019 100 %     Weight 12/16/23 1018 191 lb (86.6 kg)     Height 12/16/23 1018 6\' 1"  (1.854 m)     Head Circumference --      Peak Flow --      Pain Score 12/16/23 1018 10     Pain Loc --      Pain Education --      Exclude from Growth Chart --     Most recent vital signs: Vitals:   12/16/23 1019  BP: 116/75  Pulse: 93  Resp: 18  Temp: 98 F (36.7 C)  SpO2: 100%    General: Awake, no distress.  CV:  Good peripheral perfusion.  Resp:  Normal effort.  Abd:  No distention. Pressure over suprapubic area with palpation. Other:     ED Results / Procedures / Treatments   Labs (all labs ordered are listed, but only abnormal results are displayed) Labs Reviewed   URINALYSIS, ROUTINE W REFLEX MICROSCOPIC - Abnormal; Notable for the following components:      Result Value   Color, Urine YELLOW (*)    APPearance TURBID (*)    Hgb urine dipstick MODERATE (*)    Protein, ur 100 (*)    Leukocytes,Ua LARGE (*)    Bacteria, UA FEW (*)    All other components within normal limits     EKG  Not indicated.   RADIOLOGY  Image and radiology report reviewed and interpreted by me. Radiology report consistent with the same.  Not indicated.  PROCEDURES:  Critical Care performed: No  Procedures   MEDICATIONS ORDERED IN ED:  Medications - No data to display   IMPRESSION / MDM / ASSESSMENT AND PLAN / ED COURSE   I have reviewed the triage note.  Differential diagnosis includes, but is not limited to, urinary retention, acute cystitis, BPH  Patient's presentation is most consistent with acute illness / injury with system symptoms.  78 year old male presenting to the emergency department for treatment and evaluation of suprapubic  pressure and inability to void.  See HPI for further details.  Bladder scan indicates that he has nearly 300 mL of retained urine he has attempted to urinate multiple times since being here in the department.  Plan will be to reinsert Foley catheter and send the specimen to evaluate for acute cystitis.  Patient agreeable to the plan.  Foley catheter inserted without complication. Leg bag applied. Urinalysis does confirm cystitis. He will be discharged home with prescription for Keflex and advised to follow up with his urologist.         FINAL CLINICAL IMPRESSION(S) / ED DIAGNOSES   Final diagnoses:  Acute cystitis without hematuria  Urinary retention     Rx / DC Orders   ED Discharge Orders          Ordered    cephALEXin (KEFLEX) 500 MG capsule  3 times daily        12/16/23 1210             Note:  This document was prepared using Dragon voice recognition software and may include unintentional  dictation errors.   Chinita Pester, FNP 12/16/23 1429    Delton Prairie, MD 12/16/23 1452

## 2023-12-16 NOTE — ED Triage Notes (Signed)
 Pt comes with c/o urinary retention. Pt states he just had cath removed yesterday morning and since has only peed about 3-441mL. Pt states he feels like he is about to bust.

## 2023-12-16 NOTE — ED Notes (Signed)
 Foley drained approximately 1200ccs of urine. Pt given a leg bag and was changed for pt.  Standard leg bag given along with pt d/c instructions

## 2024-05-25 ENCOUNTER — Ambulatory Visit
Admission: EM | Admit: 2024-05-25 | Discharge: 2024-05-25 | Disposition: A | Discharge status: Home / Self Care | Attending: Physician Assistant | Admitting: Physician Assistant

## 2024-05-25 DIAGNOSIS — R21 Rash and other nonspecific skin eruption: Secondary | ICD-10-CM

## 2024-05-25 DIAGNOSIS — B0231 Zoster conjunctivitis: Secondary | ICD-10-CM

## 2024-05-25 MED ORDER — MOXIFLOXACIN HCL 0.5 % OP SOLN: 1.0000 [drp] | Freq: Three times a day (TID) | OPHTHALMIC | 0 refills | Status: AC

## 2024-05-25 MED ORDER — VALACYCLOVIR HCL 1 G PO TABS: 1000.0000 mg | ORAL_TABLET | Freq: Two times a day (BID) | ORAL | 0 refills | Status: AC

## 2024-05-25 NOTE — ED Triage Notes (Signed)
 Patient staes that he has a rash on the left side of his face on his cheek. 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

## 2024-05-25 NOTE — ED Provider Notes (Signed)
 [facility]    CSN: [id number] Arrival date & time: 05/25/24  1446      History   Chief Complaint Chief Complaint  Patient presents with   Rash    HPI Harold Curtis is a 78 y.o. male presenting for 5-6 day history of tender, vesicular rash of the left side of his face, upper lip, and around the left eye. Reports mild headaches a couple days before onset of rash. He has had some drainage from the left eye but denies eye pain. History of diplopia of the left eye and has been seen by [facility] for this. He says he thinks the rash could be poison ivy, but he has no other areas of rash. He reports symptoms have improved from onset.  HPI  Past Medical History:  Diagnosis Date   History of epilepsy    as child    There are no active problems to display for this patient.   Past Surgical History:  Procedure Laterality Date   APPENDECTOMY     COLONOSCOPY     INGUINAL HERNIA REPAIR Bilateral 08/02/2017   Procedure: HERNIA REPAIR INGUINAL ADULT BILATERAL;  Surgeon: Claudene Larinda Bolder, MD;  Location: [facility] ORS;  Service: General;  Laterality: Bilateral;       Home Medications    Prior to Admission medications   Medication Sig Start Date End Date Taking? Authorizing Provider  moxifloxacin  (VIGAMOX ) 0.5 % ophthalmic solution Place 1 drop into the left eye 3 (three) times daily for 7 days. 05/25/24 06/01/24 Yes Arvis Jolan NOVAK, PA-C  valACYclovir  (VALTREX ) 1000 MG tablet Take 1 tablet (1,000 mg total) by mouth 2 (two) times daily for 7 days. 05/25/24 06/01/24 Yes Arvis Jolan B, PA-C  b complex vitamins tablet Take 1 tablet by mouth daily.    [provider]  cholecalciferol (VITAMIN D) 1000 units tablet Take 2,000-3,000 Units by mouth daily.     [provider]  Coenzyme Q10 (CO Q-10) 100 MG CAPS Take 1 capsule by mouth daily.    [provider]  Multiple Vitamins-Minerals (MULTIVITAMIN WITH MINERALS) tablet Take 1 tablet by  mouth daily.    [provider]  saccharomyces boulardii (FLORASTOR) 250 MG capsule Take 250 mg by mouth daily.    [provider]    Family History History reviewed. No pertinent family history.  Social History Social History   Tobacco Use   Smoking status: Never   Smokeless tobacco: Never  Vaping Use   Vaping status: Never Used  Substance Use Topics   Alcohol use: No   Drug use: No     Allergies   Patient has no known allergies.   Review of Systems Review of Systems  Constitutional:  Negative for fatigue.  HENT:  Positive for facial swelling. Negative for congestion.   Eyes:  Positive for discharge and redness. Negative for photophobia, pain, itching and visual disturbance.  Skin:  Positive for rash.  Neurological:  Positive for headaches. Negative for dizziness.     Physical Exam Triage Vital Signs ED Triage Vitals  Encounter Vitals Group     BP 05/25/24 1508 (!) 140/81     Girls Systolic BP Percentile --      Girls Diastolic BP Percentile --      Boys Systolic BP Percentile --      Boys Diastolic BP Percentile --      Pulse Rate 05/25/24 1508 81     Resp 05/25/24 1508 17  Temp 05/25/24 1508 99.2 F (37.3 C)     Temp Source 05/25/24 1508 Oral     SpO2 05/25/24 1508 94 %     Weight 05/25/24 1507 186 lb (84.4 kg)     Height --      Head Circumference --      Peak Flow --      Pain Score 05/25/24 1507 0     Pain Loc --      Pain Education --      Exclude from Growth Chart --    No data found.  Updated Vital Signs BP (!) 140/81 (BP Location: Right Arm)   Pulse 81   Temp 99.2 F (37.3 C) (Oral)   Resp 17   Wt 186 lb (84.4 kg)   SpO2 94%   BMI 24.54 kg/m   Visual Acuity Right Eye Distance: 20/50 Left Eye Distance: 20/70 Bilateral Distance: 20/40 (no correction for any)   Physical Exam Vitals and nursing note reviewed.  Constitutional:      General: He is not in acute distress.    Appearance: Normal appearance. He is  well-developed. He is not ill-appearing.  HENT:     Head: Normocephalic and atraumatic.     Nose: Nose normal.     Mouth/Throat:     Mouth: Mucous membranes are moist.     Pharynx: Oropharynx is clear.  Eyes:     General: No scleral icterus.       Left eye: Discharge (slight conjunctival injection) present.    Conjunctiva/sclera: Conjunctivae normal.  Cardiovascular:     Rate and Rhythm: Normal rate and regular rhythm.  Pulmonary:     Effort: Pulmonary effort is normal. No respiratory distress.     Breath sounds: Normal breath sounds.  Musculoskeletal:     Cervical back: Neck supple.  Skin:    General: Skin is warm and dry.     Capillary Refill: Capillary refill takes less than 2 seconds.     Findings: Rash (erythema with vesicles of left side of face, left upper lip and left lower eyelid. Areas are TTP. Slight light yellow drainage from left eye. Nomal fluorescein stain.) present.  Neurological:     General: No focal deficit present.     Mental Status: He is alert. Mental status is at baseline.     Motor: No weakness.     Gait: Gait normal.  Psychiatric:        Mood and Affect: Mood normal.        Behavior: Behavior normal.      UC Treatments / Results  Labs (all labs ordered are listed, but only abnormal results are displayed) Labs Reviewed - No data to display Comprehensive Metabolic Panel (CMP) Order: [id number] Component Ref Range & Units 1 mo ago  Sodium 135 - 145 mmol/L 140  Potassium 3.5 - 5.0 mmol/L 3.9  Chloride 98 - 108 mmol/L 110 High   Carbon Dioxide (CO2) 21 - 30 mmol/L 24  Urea Nitrogen (BUN) 7 - 20 mg/dL 22 High   Creatinine 0.6 - 1.3 mg/dL 1.6 High   Glucose 70 - 140 mg/dL 90  Comment: Interpretive Data: Above is the NONFASTING reference range.  Below are the FASTING reference ranges: NORMAL:      70-99 mg/dL PREDIABETES: 899-874 mg/dL DIABETES:    > 874 mg/dL  Calcium 8.7 - 89.7 mg/dL 9.1  AST (Aspartate Aminotransferase) 15 - 41 U/L  17  ALT (Alanine Aminotransferase) 15 - 50 U/L 11 Low  Bilirubin, Total 0.4 - 1.5 mg/dL 1.6 High   Alk Phos (Alkaline Phosphatase) 24 - 110 U/L 56  Albumin 3.5 - 4.8 g/dL 3.6  Protein, Total 6.2 - 8.1 g/dL 6.6  Anion Gap 3 - 12 mmol/L 6  BUN/CREA Ratio 6 - 27 13  Glomerular Filtration Rate (eGFR) mL/min/1.73sq m 44  Comment: CKD-EPI (2021) does not include patient's race in the calculation of eGFR. Monitoring changes of plasma creatinine and eGFR over time is useful for monitoring kidney function.  This change was made on 12/09/2020.  Interpretive Ranges for eGFR(CKD-EPI 2021):  eGFR:              > 60 mL/min/1.73 sq m - Normal eGFR:              30 - 59 mL/min/1.73 sq m - Moderately Decreased eGFR:              15 - 29 mL/min/1.73 sq m - Severely Decreased eGFR:              < 15 mL/min/1.73 sq m -  Kidney Failure   Note: These eGFR calculations do not apply in acute situations when eGFR is changing rapidly or in patients on dialysis.  Resulting Agency [facility]   Specimen Collected: 04/25/24 16:45   Performed by: [facility] Last Resulted: 04/25/24 21:01  Received From: [facility]  Result Received: 05/25/24 14:51   45 mL/min Creatinine clearance, original Cockcroft-Gault  EKG   Radiology No results found.  Procedures Procedures (including critical care time)  Medications Ordered in UC Medications - No data to display  Initial Impression / Assessment and Plan / UC Course  I have reviewed the triage vital signs and the nursing notes.  Pertinent labs & imaging results that were available during my care of the patient were reviewed by me and considered in my medical decision making (see chart for details).   78 year old male presents for tender erythematous and vesicular rash of the left side of his face for the past 5 to 6 days.  Reports headache before onset of rash.  Headache has improved from onset.  There has been  slight yellowish drainage and mucus drainage from the left eye but he denies eye pain.  Vision is grossly intact on exam.  Vitals are stable.  He is overall well-appearing.  See image included in chart of patient's rash which does not cross the midline.  Fluorescein stain was negative for any dendrites or abrasions/ulcers.  Herpes zoster with conjunctivitis.  Will start patient on Valtrex .  This medication was renally dosed based on renal function panel from last month.  1000 mg twice daily x 7 days.  Send Vigamox  for conjunctivitis although it is likely viral.  Supportive care encouraged.  Discussed who patient is potentially contagious to and the healing process.  Advised him to contact his eye doctor on Monday for appointment.  Reviewed ED precautions.   Final Clinical Impressions(s) / UC Diagnoses   Final diagnoses:  Herpes zoster conjunctivitis  Rash     Discharge Instructions      -Your rash is due to shingles -You are contagious to anyone who has not had chicken pox or been vaccinated for chicken pox. Avoid young children, babies, pregnant women, elderly, immunocompromised until rash crusts over and symptoms improve. -You have pink eye but it is due to the virus. However, I did send an antibiotic eye drop to cover bacteria -May taken tylenol  for  headache/pain -Please contact your eye doctor or [place] [facility] Monday for follow up on your eye. If you have eye pain, vision loss, dizziness, worsening headaches, facial drooping go to ER.     ED Prescriptions     Medication Sig Dispense Auth. Provider   valACYclovir  (VALTREX ) 1000 MG tablet Take 1 tablet (1,000 mg total) by mouth 2 (two) times daily for 7 days. 14 tablet Arvis Huxley B, PA-C   moxifloxacin  (VIGAMOX ) 0.5 % ophthalmic solution Place 1 drop into the left eye 3 (three) times daily for 7 days. 3 mL Arvis Huxley NOVAK, PA-C      PDMP not reviewed this encounter.   Arvis Huxley NOVAK, PA-C 05/25/24 1606

## 2024-05-25 NOTE — Discharge Instructions (Addendum)
-  Your rash is due to shingles -You are contagious to anyone who has not had chicken pox or been vaccinated for chicken pox. Avoid young children, babies, pregnant women, elderly, immunocompromised until rash crusts over and symptoms improve. -You have pink eye but it is due to the virus. However, I did send an antibiotic eye drop to cover bacteria -May taken tylenol  for headache/pain -Please contact your eye doctor or [place] [facility] Monday for follow up on your eye. If you have eye pain, vision loss, dizziness, worsening headaches, facial drooping go to ER.

## 2024-06-02 ENCOUNTER — Ambulatory Visit
Admission: EM | Admit: 2024-06-02 | Discharge: 2024-06-02 | Disposition: A | Discharge status: Home / Self Care | Attending: Emergency Medicine | Admitting: Emergency Medicine

## 2024-06-02 ENCOUNTER — Encounter: Payer: Self-pay | Admitting: Emergency Medicine

## 2024-06-02 DIAGNOSIS — L03211 Cellulitis of face: Secondary | ICD-10-CM | POA: Diagnosis not present

## 2024-06-02 MED ORDER — AMOXICILLIN-POT CLAVULANATE 875-125 MG PO TABS: 1.0000 | ORAL_TABLET | Freq: Two times a day (BID) | ORAL | 0 refills | Status: AC

## 2024-06-02 NOTE — ED Provider Notes (Signed)
 [facility]    CSN: [id number] Arrival date & time: 06/02/24  1516      History   Chief Complaint Chief Complaint  Patient presents with   Medication Refill   Herpes Zoster    HPI Harold Curtis is a 78 y.o. male.   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  He he describes the rash as being itchy but not painful and he describes a yellow drainage from the scabbed lesions.  No fever.  Past Medical History:  Diagnosis Date   History of epilepsy    as child    There are no active problems to display for this patient.   Past Surgical History:  Procedure Laterality Date   APPENDECTOMY     COLONOSCOPY     INGUINAL HERNIA REPAIR Bilateral 08/02/2017   Procedure: HERNIA REPAIR INGUINAL ADULT BILATERAL;  Surgeon: Claudene Larinda Bolder, MD;  Location: [facility] ORS;  Service: General;  Laterality: Bilateral;       Home Medications    Prior to Admission medications   Medication Sig Start Date End Date Taking? Authorizing Provider  amoxicillin -clavulanate (AUGMENTIN ) 875-125 MG tablet Take 1 tablet by mouth every 12 (twelve) hours for 7 days. 06/02/24 06/09/24 Yes Bernardino Ditch, NP  b complex vitamins tablet Take 1 tablet by mouth daily.    [provider]  cholecalciferol (VITAMIN D) 1000 units tablet Take 2,000-3,000 Units by mouth daily.     [provider]  Coenzyme Q10 (CO Q-10) 100 MG CAPS Take 1 capsule by mouth daily.    [provider]  Multiple Vitamins-Minerals (MULTIVITAMIN WITH MINERALS) tablet Take 1 tablet by mouth daily.    [provider]  saccharomyces boulardii (FLORASTOR) 250 MG capsule Take 250 mg by mouth daily.    [provider]    Family History History reviewed. No pertinent family history.  Social History Social History    Tobacco Use   Smoking status: Never   Smokeless tobacco: Never  Vaping Use   Vaping status: Never Used  Substance Use Topics   Alcohol use: No   Drug use: No     Allergies   Patient has no known allergies.   Review of Systems Review of Systems  Constitutional:  Negative for fever.  Skin:  Positive for rash.     Physical Exam Triage Vital Signs ED Triage Vitals  Encounter Vitals Group     BP      Girls Systolic BP Percentile      Girls Diastolic BP Percentile      Boys Systolic BP Percentile      Boys Diastolic BP Percentile      Pulse      Resp      Temp      Temp src      SpO2      Weight      Height      Head Circumference      Peak Flow      Pain Score      Pain Loc      Pain Education      Exclude from Growth Chart    No data found.  Updated Vital Signs BP 134/89 (BP Location: Right Arm)   Pulse 78   Temp 98.3 F (36.8 C) (Oral)   Resp 15  Ht 6' 1 (1.854 m)   Wt 186 lb 1.1 oz (84.4 kg)   SpO2 96%   BMI 24.55 kg/m   Visual Acuity Right Eye Distance:   Left Eye Distance:   Bilateral Distance:    Right Eye Near:   Left Eye Near:    Bilateral Near:     Physical Exam Vitals and nursing note reviewed.  Constitutional:      Appearance: Normal appearance.  Skin:    General: Skin is warm and dry.     Capillary Refill: Capillary refill takes less than 2 seconds.     Findings: Erythema and rash present.  Neurological:     General: No focal deficit present.     Mental Status: He is alert and oriented to person, place, and time.      UC Treatments / Results  Labs (all labs ordered are listed, but only abnormal results are displayed) Labs Reviewed - No data to display  EKG   Radiology No results found.  Procedures Procedures (including critical care time)  Medications Ordered in UC Medications - No data to display  Initial Impression / Assessment and Plan / UC Course  I have reviewed the triage vital signs and the  nursing notes.  Pertinent labs & imaging results that were available during my care of the patient were reviewed by me and considered in my medical decision making (see chart for details).   Patient is a pleasant, nontoxic-appearing 78 year old male presenting for reevaluation of a shingles rash on the left side of his face.  He was seen at this urgent care on 05/25/2024 and was discharged home on Valtrex  1000 mg twice daily due to renal dosing.  CMP from July showed creatinine of 1.6 with a BUN of 22.  As you can see in image above, the patient has multiple scabbed vesicles all over the left side of his face and nose.  The infraorbital region is erythematous and mildly edematous.  There is no induration or fluctuance.  He describes the area as feeling numb and denies any pain.  There is a honey crusting which is concerning for possible secondary bacterial infection.  I will discharge him home on Augmentin  875 twice daily for 7 days for treatment of potential bacterial secondary infection.  I will have the patient apply topical Benadryl cream to his face 4 times a day as needed to help with itching.   Final Clinical Impressions(s) / UC Diagnoses   Final diagnoses:  Cellulitis of face     Discharge Instructions      Take the Augmentin  twice daily with food for 7 days for treatment of the bacterial secondary infection from your shingles.  To help with the itching you may apply topical Benadryl cream 4 times a day, you may also use over-the-counter Claritin, Zyrtec, or Allegra during the day to help with itching and use Benadryl at bedtime.  If you develop any increased redness, swelling, pus drainage from the lesions, worsening headache, or fever either return for reevaluation or seek care in the ER.     ED Prescriptions     Medication Sig Dispense Auth. Provider   amoxicillin -clavulanate (AUGMENTIN ) 875-125 MG tablet Take 1 tablet by mouth every 12 (twelve) hours for 7 days. 14 tablet  Bernardino Ditch, NP      PDMP not reviewed this encounter.   Bernardino Ditch, NP 06/02/24 [phone]

## 2024-06-02 NOTE — ED Triage Notes (Signed)
 Patient was diagnosed with Shingles on 05/25/24.  Patient states that he still has the rash with some drainage from it.  Patient continues to take Tylenol  for his headache.  Patient unsure of fevers.  Patient thinks he might need a refill on his medicine.

## 2024-06-02 NOTE — Discharge Instructions (Addendum)
 Take the Augmentin  twice daily with food for 7 days for treatment of the bacterial secondary infection from your shingles.  To help with the itching you may apply topical Benadryl cream 4 times a day, you may also use over-the-counter Claritin, Zyrtec, or Allegra during the day to help with itching and use Benadryl at bedtime.  If you develop any increased redness, swelling, pus drainage from the lesions, worsening headache, or fever either return for reevaluation or seek care in the ER.
